# Patient Record
Sex: Male | Born: 1990 | Race: White | Hispanic: No | Marital: Single | Smoking: Current every day smoker
Health system: Southern US, Community
[De-identification: ages and names within clinical notes are randomized; demographics above are authoritative.]

## PROBLEM LIST (undated history)

## (undated) DIAGNOSIS — F32A Depression, unspecified: Secondary | ICD-10-CM

## (undated) DIAGNOSIS — F329 Major depressive disorder, single episode, unspecified: Secondary | ICD-10-CM

## (undated) HISTORY — DX: Major depressive disorder, single episode, unspecified: F32.9

## (undated) HISTORY — DX: Depression, unspecified: F32.A

---
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
## 2015-01-28 ENCOUNTER — Encounter: Payer: Self-pay | Admitting: Family Medicine

## 2015-01-28 ENCOUNTER — Ambulatory Visit (INDEPENDENT_AMBULATORY_CARE_PROVIDER_SITE_OTHER): Payer: BLUE CROSS/BLUE SHIELD | Admitting: Family Medicine

## 2015-01-28 VITALS — BP 116/60 | HR 94 | Temp 98.6°F | Resp 16 | Ht 72.0 in | Wt 168.0 lb

## 2015-01-28 DIAGNOSIS — F330 Major depressive disorder, recurrent, mild: Secondary | ICD-10-CM | POA: Diagnosis not present

## 2015-01-28 NOTE — Patient Instructions (Signed)

## 2015-01-28 NOTE — Progress Notes (Signed)
Name: Cody [name]   MRN: [id number]    DOB: June 08, 1991   Date:01/28/2015       Progress Note  Subjective  Chief Complaint  Chief Complaint  Patient presents with  . Referral    wants to see a psychiatrist  . Depression    HPI  Depression: Patient complains of depression. He complains of depressed mood, difficulty concentrating, fatigue, feelings of worthlessness/guilt and hopelessness. Onset was approximately several years ago age 24 years old after his maternal grandfather died who he as close to. Now more recently relationship issues with his significant other has him feeling low and hopeless. He has not tried medication therapy but mother was on Zoloft for a few years. He is interested in psychological therapy and counseling. He denies current suicidal and homicidal plan or intent.   Family history significant for depression. Possible organic causes contributing are: none.  Risk factors: positive family history in  mother. He has grown up in [place], finished college at [employer] and now working at his [employer]. Previous treatment includes no prescription medications or counseling.   Active Ambulatory Problems    Diagnosis Date Noted  . Mild recurrent major depression 01/28/2015   Resolved Ambulatory Problems    Diagnosis Date Noted  . No Resolved Ambulatory Problems   Past Medical History  Diagnosis Date  . Depression     History  Substance Use Topics  . Smoking status: Current Every Day Smoker -- 11.00 packs/day for 8 years    Types: Cigarettes  . Smokeless tobacco: Not on file  . Alcohol Use: 0.0 oz/week    0 Standard drinks or equivalent per week     Comment: rarely    No current outpatient prescriptions on file.  History reviewed. No pertinent past surgical history.  Family History  Problem Relation Age of Onset  . Depression Mother     No Known Allergies   Review of Systems  CONSTITUTIONAL: No significant weight changes, fever, chills,  weakness or fatigue.  HEENT:  - Eyes: No visual changes.  - Ears: No auditory changes. No pain.  - Nose: No sneezing, congestion, runny nose. - Throat: No sore throat. No changes in swallowing. SKIN: No rash or itching.  CARDIOVASCULAR: No chest pain, chest pressure or chest discomfort. No palpitations or edema.  RESPIRATORY: No shortness of breath, cough or sputum.  GASTROINTESTINAL: No anorexia, nausea, vomiting. No changes in bowel habits. No abdominal pain or blood.  GENITOURINARY: No dysuria. No frequency. No discharge.  NEUROLOGICAL: No headache, dizziness, syncope, paralysis, ataxia, numbness or tingling in the extremities. No memory changes. No change in bowel or bladder control.  MUSCULOSKELETAL: No joint pain. No muscle pain. HEMATOLOGIC: No anemia, bleeding or bruising.  LYMPHATICS: No enlarged lymph nodes.  PSYCHIATRIC: Yes change in mood. No change in sleep pattern.  ENDOCRINOLOGIC: No reports of sweating, cold or heat intolerance. No polyuria or polydipsia.    Depression screen PHQ 2/9 01/28/2015  Decreased Interest 1  Down, Depressed, Hopeless 1  PHQ - 2 Score 2  Altered sleeping 1  Tired, decreased energy 0  Change in appetite 2  Feeling bad or failure about yourself  2  Trouble concentrating 1  Moving slowly or fidgety/restless 1  Suicidal thoughts 0  PHQ-9 Score 9  Difficult doing work/chores Somewhat difficult     Objective  BP 116/60 mmHg  Pulse 94  Temp(Src) 98.6 F (37 C) (Oral)  Resp 16  Ht 6' (1.829 m)  Wt 168 lb (  76.204 kg)  BMI 22.78 kg/m2  SpO2 97% Body mass index is 22.78 kg/(m^2).  Physical Exam  Constitutional: Patient appears well-developed and well-nourished. In no distress.  HEENT:  - Head: Normocephalic and atraumatic.  - Ears: Bilateral TMs gray, no erythema or effusion - Nose: Nasal mucosa moist - Mouth/Throat: Oropharynx is clear and moist. No tonsillar hypertrophy or erythema. No post nasal drainage.  - Eyes: Conjunctivae  clear, EOM movements normal. PERRLA. No scleral icterus.  Neck: Normal range of motion. Neck supple. No JVD present. No thyromegaly present.  Cardiovascular: Normal rate, regular rhythm and normal heart sounds.  No murmur heard.  Pulmonary/Chest: Effort normal and breath sounds normal. No respiratory distress. Musculoskeletal: Normal range of motion bilateral UE and LE, no joint effusions. Peripheral vascular: Bilateral LE no edema. Neurological: CN II-XII grossly intact with no focal deficits. Alert and oriented to person, place, and time. Coordination, balance, strength, speech and gait are normal.  Skin: Skin is warm and dry. No rash noted. No erythema.  Psychiatric: Patient is interactive and pleasant but does have a flat affect and does not smile much. Behavior is normal in office today. Judgment and thought content normal in office today.   Assessment & Plan 1. Mild recurrent major depression Discussed prescription treatment options as well as diagnosis. He will start with psychological therapy first.  - Ambulatory referral to Psychology

## 2015-04-09 ENCOUNTER — Ambulatory Visit: Payer: BLUE CROSS/BLUE SHIELD | Admitting: Family Medicine

## 2015-08-09 ENCOUNTER — Ambulatory Visit (INDEPENDENT_AMBULATORY_CARE_PROVIDER_SITE_OTHER): Payer: BLUE CROSS/BLUE SHIELD | Admitting: Family Medicine

## 2015-08-09 ENCOUNTER — Encounter: Payer: Self-pay | Admitting: Family Medicine

## 2015-08-09 VITALS — BP 120/70 | HR 75 | Temp 98.6°F | Resp 20 | Ht 72.0 in | Wt 177.2 lb

## 2015-08-09 DIAGNOSIS — F330 Major depressive disorder, recurrent, mild: Secondary | ICD-10-CM

## 2015-08-09 DIAGNOSIS — F529 Unspecified sexual dysfunction not due to a substance or known physiological condition: Secondary | ICD-10-CM | POA: Diagnosis not present

## 2015-08-09 DIAGNOSIS — Z113 Encounter for screening for infections with a predominantly sexual mode of transmission: Secondary | ICD-10-CM | POA: Insufficient documentation

## 2015-08-09 NOTE — Progress Notes (Signed)
Name: Cody [name]   MRN: [id number]    DOB: 03-Apr-1991   Date:08/09/2015       Progress Note  Subjective  Chief Complaint  Chief Complaint  Patient presents with  . Referral    requesting a referral for a therapist    HPI  Cody [name] is a 25 year old male last seen at this practice 01/28/15 when he established care. At the time [name] was referred to a psychologist for depression. He met with Lynann Beaver and reported good sessions at the time. He expressed symptoms of depressed mood, difficulty concentrating, fatigue, feelings of worthlessness/guilt and hopelessness. Onset was approximately several years ago age 33 years old after his maternal grandfather died who he was close to. Last year in 2016 relationship issues with his significant other had him feeling low and hopeless. He has not tried medication therapy but mother was on Zoloft for a few years. He is interested in psychological therapy and counseling. He denies current suicidal and homicidal plan or intent. Family history significant for depression. Possible organic causes contributing are: none. Risk factors: positive family history in mother. He has grown up in [place], finished college at [employer] and now working at his [employer]. Previous treatment includes no prescription medications or counseling. At our last visit he declined initiating medication therapy.  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 His sexual urges are likely ways to deflect and distract deep seeded emotional issues. He is very interested in seeking psychological therapy and guidance for his issues and unfortunately the previously referred psychologist Mitzie Na PhD) did not have any background with sex addiction to offer further help.   Past Medical History  Diagnosis Date  . Depression     Patient Active Problem List    Diagnosis Date Noted  . Mild recurrent major depression ([place]) 01/28/2015    Social History  Substance Use Topics  . Smoking status: Current Every Day Smoker -- 11.00 packs/day for 8 years    Types: Cigarettes  . Smokeless tobacco: Not on file  . Alcohol Use: 0.0 oz/week    0 Standard drinks or equivalent per week     Comment: rarely     Current outpatient prescriptions:  .  PROAIR HFA 108 (90 Base) MCG/ACT inhaler, inhale 2 puffs by mouth every 4 to 6 hours if needed for cough and wheezing, Disp: , Rfl: 0  No past surgical history on file.  Family History  Problem Relation Age of Onset  . Depression Mother     No Known Allergies   Review of Systems  CONSTITUTIONAL: No significant weight changes, fever, chills, weakness or fatigue.  HEENT:  - Eyes: No visual changes.  - Ears: No auditory changes. No pain.  - Nose: No sneezing, congestion, runny nose. - Throat: No sore throat. No changes in swallowing. SKIN: No rash or itching.  CARDIOVASCULAR: No chest pain, chest pressure or chest discomfort. No palpitations or edema.  RESPIRATORY: No shortness of breath, cough or sputum.  NEUROLOGICAL: No headache, dizziness, syncope, paralysis, ataxia, numbness or tingling in the extremities. No memory changes. No change in bowel or bladder control.  MUSCULOSKELETAL: No joint pain. No muscle pain. HEMATOLOGIC: No anemia, bleeding or bruising.  LYMPHATICS: No enlarged lymph nodes.  PSYCHIATRIC: No change in mood. No change in sleep pattern.  ENDOCRINOLOGIC: No reports of sweating, cold or heat intolerance. No polyuria or polydipsia.  Objective  BP 120/70 mmHg  Pulse 75  Temp(Src) 98.6 F (37 C)  Resp 20  Ht 6' (1.829 m)  Wt 177 lb 3 oz (80.372 kg)  BMI 24.03 kg/m2  SpO2 98% Body mass index is 24.03 kg/(m^2).  Physical Exam  Constitutional: Patient appears well-developed and well-nourished. In no distress.  Neck: Normal range of motion. Neck supple. No JVD  present. No thyromegaly present.  Cardiovascular: Normal rate, regular rhythm and normal heart sounds.  No murmur heard.  Pulmonary/Chest: Effort normal and breath sounds normal. No respiratory distress. Musculoskeletal: Normal range of motion bilateral UE and LE, no joint effusions. Peripheral vascular: Bilateral LE no edema. Neurological: CN II-XII grossly intact with no focal deficits. Alert and oriented to person, place, and time. Coordination, balance, strength, speech and gait are normal.  Skin: Skin is warm and dry. No rash noted. No erythema.  Psychiatric: Patient has a stable mood and affect. Behavior is normal in office today. Judgment and thought content normal in office today.   Assessment & Plan  1. Mild recurrent major depression ([place]) Denies suicidal, homicidal thoughts or self abuse such as cutting.   - Ambulatory referral to Psychology  2. Sexual arousal disorder Rare situation but I commended him for seeking help and will assist in finding him the right help.  - Ambulatory referral to Psychology  3. Screening for STD (sexually transmitted disease) High risk sexual behavior.  - Chlamydia/Gonococcus/Trichomonas, NAA - HIV antibody - RPR - Hepatitis C antibody - HSV 2 antibody, IgG

## 2015-08-10 LAB — RPR: RPR Ser Ql: NONREACTIVE

## 2015-08-10 LAB — HEPATITIS C ANTIBODY: Hep C Virus Ab: <0.1 s/co ratio (ref 0.0–0.9)

## 2015-08-10 LAB — HSV 2 ANTIBODY, IGG: HSV 2 Glycoprotein G Ab, IgG: <0.91 index (ref 0.00–0.90)

## 2015-08-10 LAB — HIV ANTIBODY (ROUTINE TESTING W REFLEX): HIV SCREEN 4TH GENERATION: NONREACTIVE

## 2015-08-12 LAB — CHLAMYDIA/GONOCOCCUS/TRICHOMONAS, NAA
Chlamydia by NAA: NEGATIVE
GONOCOCCUS BY NAA: NEGATIVE
TRICH VAG BY NAA: NEGATIVE

## 2015-09-08 ENCOUNTER — Telehealth: Payer: Self-pay | Admitting: Family Medicine

## 2015-09-08 NOTE — Telephone Encounter (Signed)
Pt states he was supposed to get a referral to [facility] and he would like a call back.

## 2015-09-08 NOTE — Telephone Encounter (Signed)
Referral was sent to [facility], once it is processed due to security settings, you no longer can see it. Just spoke with Leigh from [facility], they will not be able to help him with his situation.  She stated she gave him Inetta Fermoina Thompson's number along with the [facility] [place] for him to call. If needed I can get another referral entered otherwise, he may call to schedule his own appointment.

## 2015-09-08 NOTE — Telephone Encounter (Signed)
I don't see a referral for him can you please order?

## 2015-09-08 NOTE — Telephone Encounter (Signed)
If he can call and make an appointment with Cody [name] or [facility] on his own that would be fine.

## 2015-09-15 ENCOUNTER — Encounter: Payer: Self-pay | Admitting: Licensed Clinical Social Worker

## 2015-09-15 ENCOUNTER — Ambulatory Visit (INDEPENDENT_AMBULATORY_CARE_PROVIDER_SITE_OTHER): Payer: BLUE CROSS/BLUE SHIELD | Admitting: Licensed Clinical Social Worker

## 2015-09-15 DIAGNOSIS — F1220 Cannabis dependence, uncomplicated: Secondary | ICD-10-CM

## 2015-09-15 DIAGNOSIS — F528 Other sexual dysfunction not due to a substance or known physiological condition: Secondary | ICD-10-CM

## 2015-09-15 DIAGNOSIS — F329 Major depressive disorder, single episode, unspecified: Secondary | ICD-10-CM | POA: Diagnosis not present

## 2015-09-15 DIAGNOSIS — F1290 Cannabis use, unspecified, uncomplicated: Secondary | ICD-10-CM | POA: Diagnosis not present

## 2015-09-15 DIAGNOSIS — F32A Depression, unspecified: Secondary | ICD-10-CM
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
## 2015-09-29 ENCOUNTER — Ambulatory Visit (INDEPENDENT_AMBULATORY_CARE_PROVIDER_SITE_OTHER): Payer: BLUE CROSS/BLUE SHIELD | Admitting: Licensed Clinical Social Worker

## 2015-09-29 DIAGNOSIS — F1290 Cannabis use, unspecified, uncomplicated: Secondary | ICD-10-CM | POA: Diagnosis not present

## 2015-09-29 DIAGNOSIS — F1220 Cannabis dependence, uncomplicated: Secondary | ICD-10-CM

## 2015-09-29 DIAGNOSIS — F528 Other sexual dysfunction not due to a substance or known physiological condition: Secondary | ICD-10-CM

## 2015-09-29 DIAGNOSIS — F329 Major depressive disorder, single episode, unspecified: Secondary | ICD-10-CM | POA: Diagnosis not present

## 2015-09-29 DIAGNOSIS — F32A Depression, unspecified: Secondary | ICD-10-CM

## 2015-09-29 NOTE — Progress Notes (Signed)
   THERAPIST PROGRESS NOTE  Session Time: 2:10 PM to 2:58 PM  Participation Level: Active  Behavioral Response: CasualAlertEuthymic  Type of Therapy: Individual Therapy  Treatment Goals addressed: Communication skills to build healthy relationships, Emotional Regulation, Coping and Diagnosis: Depression, Unspecified Depression Type, Cannabis use Disorder, moderate, Satyriasis  Interventions: DBT, Supportive and Other: Skills to build healthy relationships  Summary: Cody [name] is a 25 y.o. male who presents by saying things went pretty well the last two weeks. Shared that he masturbated related to depression. It came from spending a lot of time alone. It what how to deal with his alone time. Used this because bored or bad feelings. Shared that he lost his grandfather and he never dealt with his death. He kept to himself more and was alone. He was 14 when he lost his grandfather. Shared that he was close to his grandfather. Overwhelmed with sadness thinking that he is gone and emptiness. Masturbating  has interfered with relationship with girlfriend. Everything until a month ago was lying and cheating on her. It turned into talking to people online and meeting people. He has opened to her and has made the relationship better. He said that he doesn't want to be the person he was in the past. He shared that he doesn't know her as she knows him. He isn't as in tune with her as she is with him. She feels bad when he can't get give her what she needs. Patient shared that she lost feelings and empathy for him and started having feelings for someone else. Reviewed today's session. He stressed that he is not over his grandfather's death. He is taking steps dealing with masturbation, but not there yet. He realizes that opening up about how he is feeling is part of taking positive steps He recognizes he has to work on being a partner. He has to open up to more people just his significant other. Just  saying things out loud about how he feels is acknowledging what is going on and helping to address issues. Hasn't masturbated in a month   Suicidal/Homicidal: No  Therapist Response: Therapist completed treatment plan. Discussed healthy ways to manage feelings rather than numbing or stuffing them such as sharing them to process them. Therapist encouraged patient with opening up to others to work on building healthy relationships. Therapist encouraged patient to implement healthy relationship skills with his relationship. Therapist discussed diversifying ways that patient manages emotions. Therapist worked with patient to open up and allow patient to grieve the death of his grandfather.   Plan: Return again in 1 week. 2.He wants to open himself up more to his roommates.   Diagnosis: Axis I: Depression, Unspecified Depression Type, Cannabis use Disorder, moderate, Satyriasis    Axis II: na    Cody [name] A 09/29/2015

## 2015-10-13 ENCOUNTER — Ambulatory Visit (INDEPENDENT_AMBULATORY_CARE_PROVIDER_SITE_OTHER): Payer: BLUE CROSS/BLUE SHIELD | Admitting: Licensed Clinical Social Worker

## 2015-10-13 DIAGNOSIS — F329 Major depressive disorder, single episode, unspecified: Secondary | ICD-10-CM

## 2015-10-13 DIAGNOSIS — F32A Depression, unspecified: Secondary | ICD-10-CM

## 2015-10-13 DIAGNOSIS — F1220 Cannabis dependence, uncomplicated: Secondary | ICD-10-CM

## 2015-10-13 DIAGNOSIS — F1290 Cannabis use, unspecified, uncomplicated: Secondary | ICD-10-CM | POA: Diagnosis not present

## 2015-10-13 DIAGNOSIS — F528 Other sexual dysfunction not due to a substance or known physiological condition: Secondary | ICD-10-CM | POA: Diagnosis not present

## 2015-10-13 NOTE — Progress Notes (Addendum)
THERAPIST PROGRESS NOTE  Session Time: 3:05 PM-3:50 PM  Participation Level: Active  Behavioral Response: CasualAlertEuthymic  Type of Therapy: Individual Therapy  Treatment Goals addressed: Develop Healthy Relationships, find healthy ways to manage emotions,Coping and Diagnosis:  Depression, Unspecified Depression Type, Satyriasis,  Interventions: Motivational Interviewing, Supportive and Other: Building Healthy Relationships, Grief processing  Summary: Cody [name] is [name] 25 y.o. male who presents with the update of working on relationships and being closer with people he should have been closer with. "Everything has been going pretty well". He committed to things he said he would do. He approached his roommate and related to her that he appreciated her and that she is someone that he wants to develop [name] relationship for support. She said that is the first time he opened up and that she was happy he had opened up. He has been writing about his grandfather. "It is [name] mixed bag of feelings". The first time was [name] lot more sadness then the second and third time. He said that he did feel relief. He said instead of pushing it down, he is letting it come to the surface. Discussed benefits of journal writing. Keeping things in his head he can't look at it as well as putting it on paper he can view himself better. He feels that he unlocked [name] few things that he noticed about his grandfather. Looking over at some the ways that he talked to himself about him. He felt like he was able to let go [name] little bit. He is able to see him with more of his flaws and this has helped him to let go. It is helpful for him to be more understanding with his parents who drank. He is able to put himself in their shoes and see where he turned to negative coping that other people turn to negative coping strategies. Discussed how he stuffs his feelings and bringing them to surface and opening up helped him not to cover up,  not deal with them and turn to unhealthy strategies. Discussed [name] conflict with girlfriend and he was able to work through [name] conflict through communicating. Girlfriend shared that he notices her more and more aware of her feelings. Patient is putting more effort into strengthening and creating intimacy in their relationships. Has had 2-3 times where he has been tempted to masturbate. He finds another behavior to replace that compulsion and this has meant putting his attention instead to his girlfriend. He thinks that his needs are getting met through relationship. He is involving his girlfriend with his sexual activity and reassures her. Patient summarized session and he relates that he feels he has found healthy ways to manage his emotions. He said that he really is starting the journey of developing his relationship. He learned how effective writing in [name] journal is and how much he can unlock from writing things down.     Suicidal/Homicidal: No  Therapist Response: Therapist provided reinforcement of positive steps patient is taking and encouragement by discussing how relationships are enriching aspect in life and that patient is shows commitment to work developing relationships. Therapist pointed out that patient that holding feelings in have not been an effective way for him to manage his feelings and that he had masturbating had been [name] way to cover up and not deal with feelings. Patient's journal writing and sharing feelings was [name] healthy way he has found to manage his feelings. Therapist worked on building insight to help patient recognize the rewards  he is getting from opening up to motivate him to continue positive behaviors. Therapist discussed grief process and supported patient as he works through grief with his grandfather.    Plan: Return again in 2 weeks.2.Branch out and broaden horizon by opening up to develop more personal connections with current relationships.    Diagnosis: Axis I:  Depression, Unspecified Depression type, Cannabis Use Disorder, moderate dependence, Satyriasis    Axis II: n/[name]    Cody [name] 10/13/2015

## 2015-10-27 ENCOUNTER — Ambulatory Visit: Payer: No Typology Code available for payment source | Admitting: Licensed Clinical Social Worker

## 2015-11-01 ENCOUNTER — Ambulatory Visit: Payer: No Typology Code available for payment source | Admitting: Licensed Clinical Social Worker

## 2015-11-10 ENCOUNTER — Ambulatory Visit (INDEPENDENT_AMBULATORY_CARE_PROVIDER_SITE_OTHER): Payer: BLUE CROSS/BLUE SHIELD | Admitting: Licensed Clinical Social Worker

## 2015-11-10 DIAGNOSIS — F1290 Cannabis use, unspecified, uncomplicated: Secondary | ICD-10-CM

## 2015-11-10 DIAGNOSIS — F329 Major depressive disorder, single episode, unspecified: Secondary | ICD-10-CM

## 2015-11-10 DIAGNOSIS — F1220 Cannabis dependence, uncomplicated: Secondary | ICD-10-CM

## 2015-11-10 DIAGNOSIS — F32A Depression, unspecified: Secondary | ICD-10-CM

## 2015-11-15 NOTE — Progress Notes (Signed)
   THERAPIST PROGRESS NOTE  Session Time: 60min  Participation Level: Active  Behavioral Response: CasualAlertAnxious  Type of Therapy: Individual Therapy  Treatment Goals addressed: Coping and Diagnosis: Depression & Cannabis Use  Interventions: CBT, Motivational Interviewing, Solution Focused and Family Systems  Summary: Cody [name] is a 25 y.o. male who presents with continued symptoms of his diagnosis of depression and Cannabis Usage.  Patient was able to provide current and history of symptoms.  Discussion of his current living situation and how he is managing to avoid isolation and sadness.  Discussion of current Cannabis usage and discussed coping and triggers.  Patient denies sexual arousal concerns.   Suicidal/Homicidal: Nowithout intent/plan  Therapist Response: LCSW provided Patient with ongoing emotional support and encouragement.  Normalized his feelings.  Commended Patient on her progress and reinforced the importance of client staying focused on his own strengths and resources and resiliency. Processed various strategies for dealing with stressors.    Plan: Return again in 2 weeks.  Diagnosis: Axis I: Substance Abuse and Depression    Axis II: No diagnosis    Cody Elk[place]ole M Londen Bok, LCSW 11/11/2015

## 2015-12-13 ENCOUNTER — Ambulatory Visit (INDEPENDENT_AMBULATORY_CARE_PROVIDER_SITE_OTHER): Payer: BLUE CROSS/BLUE SHIELD | Admitting: Licensed Clinical Social Worker

## 2015-12-13 DIAGNOSIS — F32A Depression, unspecified: Secondary | ICD-10-CM

## 2015-12-13 DIAGNOSIS — F1290 Cannabis use, unspecified, uncomplicated: Secondary | ICD-10-CM

## 2015-12-13 DIAGNOSIS — F329 Major depressive disorder, single episode, unspecified: Secondary | ICD-10-CM | POA: Diagnosis not present

## 2015-12-13 DIAGNOSIS — F1220 Cannabis dependence, uncomplicated: Secondary | ICD-10-CM

## 2015-12-21 NOTE — Progress Notes (Signed)
   THERAPIST PROGRESS NOTE  Session Time: 7060  Participation Level: Active  Behavioral Response: CasualAlertDepressed  Type of Therapy: Individual Therapy  Treatment Goals addressed: Coping  Interventions: CBT, Motivational Interviewing, Solution Focused, Strength-based, Supportive and Reframing  Summary: Cody RavensChristopher L [name] is a 25 y.o. male who presents with continued symptoms of his diagnosis.  He was able to express his stressors since the previous session.  He reports that his housing has become extremely stressful for him. He reports his girlfriend lost her job and his ability to roommates have broken up. He is currently fearful that he will have to pay, the entire rent. He reports while thinking of this it makes him want to smoke marijuana more often to relax. He denies using coping skills. Discussion of his current symptoms and coping skills that may be helpful if he uses on a daily basis. Discussion of the miracle question. He is unsure what his miracle looks like.  Suicidal/Homicidal: Nowithout intent/plan  Therapist Response: LCSW provided Patient with ongoing emotional support and encouragement.  Normalized her feelings.  Commended Patient on her progress and reinforced the importance of client staying focused on her own strengths and resources and resiliency. Processed various strategies for dealing with stressors.    Plan: Return again in 2 weeks.  Diagnosis: Axis I: Depression & Cannabis    Axis II: No diagnosis    Marinda Elkicole M Peacock, LCSW 12/14/2015

## 2015-12-27 ENCOUNTER — Ambulatory Visit: Payer: No Typology Code available for payment source | Admitting: Licensed Clinical Social Worker
